# Patient Record
Sex: Male | Born: 1995
Health system: Southern US, Community
[De-identification: ages and names within clinical notes are randomized; demographics above are authoritative.]

## PROBLEM LIST (undated history)

## (undated) DIAGNOSIS — F339 Major depressive disorder, recurrent, unspecified: Secondary | ICD-10-CM

## (undated) DIAGNOSIS — F429 Obsessive-compulsive disorder, unspecified: Secondary | ICD-10-CM

## (undated) HISTORY — DX: Major depressive disorder, recurrent, unspecified: F33.9

## (undated) HISTORY — PX: OTHER SURGICAL HISTORY: SHX169

---
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
## 2017-12-16 ENCOUNTER — Ambulatory Visit: Payer: Self-pay | Admitting: Family Medicine

## 2017-12-16 NOTE — Progress Notes (Signed)
Subjective:    Guy [name] is a 22 y.o. male who presents today for his Complete Annual Exam.   Current Outpatient Medications:  .  escitalopram (LEXAPRO) 10 MG tablet, 1 1/2 tab daily, Disp: 30 tablet, Rfl: 5  [facility]17/2017  Decreased Interest 0  Down, Depressed, Hopeless 0  PHQ - 2 Score 0  Altered sleeping 2  Tired, decreased energy 0  Change in appetite 0  Feeling bad or failure about yourself  0  Trouble concentrating 0  Moving slowly or fidgety/restless 0  Suicidal thoughts 0  PHQ-9 Score 2  Difficult doing work/chores Not difficult at all   Health Maintenance Due  Topic Date Due  . HIV Screening  03/12/2012  . TETANUS/TDAP  03/12/2016    PMHx, SurgHx, SocialHx, Medications, and Allergies were reviewed in the Visit Navigator and updated as appropriate.   Past Medical History:  Diagnosis Date  . MDD (major depressive disorder), recurrent episode (HCC)   . OCD (obsessive compulsive disorder)     Past Surgical History:  Procedure Laterality Date  . ACL replacement Bilateral    2 on right 1 on left    History reviewed. No pertinent family history. Social History   Tobacco Use  . Smoking status: Never Smoker  . Smokeless tobacco: Never Used  Substance Use Topics  . Alcohol use: No    Alcohol/week: 0.0 oz  . Drug use: No    Review of Systems:   Pertinent items are noted in the HPI. Otherwise, ROS is negative.  Objective:   Vitals:   12/17/17 1021  BP: 108/62  Pulse: 84  Temp: 98.6 F (37 C)  SpO2: 98%   Body mass index is 27.62 kg/m.  General Appearance:  Alert, cooperative, no distress, appears stated age  Head:  Normocephalic, without obvious abnormality, atraumatic  Eyes:  PERRL, conjunctiva/corneas clear, EOM's intact, fundi benign, both eyes       Ears:  Normal TM's and external ear canals, both ears  Nose: Nares normal, septum midline, mucosa normal, no drainage    or sinus tenderness  Throat: Lips, mucosa,  and tongue normal; teeth and gums normal  Neck: Supple, symmetrical, trachea midline, no adenopathy; thyroid:  No enlargement/tenderness/nodules; no carotit bruit or JVD  Back:   Symmetric, no curvature, ROM normal, no CVA tenderness  Lungs:   Clear to auscultation bilaterally, respirations unlabored  Chest wall:  No tenderness or deformity  Heart:  Regular rate and rhythm, S1 and S2 normal, no murmur, rub   or gallop  Abdomen:   Soft, non-tender, bowel sounds active all four quadrants, no masses, no organomegaly  Extremities: Extremities normal, atraumatic, no cyanosis or edema  Prostate: Not done.   Skin: Skin color, texture, turgor normal, no rashes or lesions  Lymph nodes: Cervical, supraclavicular, and axillary nodes normal  Neurologic: CNII-XII grossly intact. Normal strength, sensation and reflexes throughout    Assessment/Plan:   Guy [name] was seen today for establish care.  Diagnoses and all orders for this visit:  Routine physical examination  Encounter for screening for HIV -     HIV antibody  Weight gain -     CBC with Differential/Platelet -     Comprehensive metabolic panel -     Lipid panel  Dysthymia -     escitalopram (LEXAPRO) 10 MG tablet; Take 1 1/2 every morning   Patient Counseling: [x]   Nutrition: Stressed importance of moderation in sodium/caffeine intake, saturated fat and cholesterol, caloric  balance, sufficient intake of fresh fruits, vegetables, and fiber.  [x]   Stressed the importance of regular exercise.   []   Substance Abuse: Discussed cessation/primary prevention of tobacco, alcohol, or other drug use; driving or other dangerous activities under the influence; availability of treatment for abuse.   [x]   Injury prevention: Discussed safety belts, safety helmets, smoke detector, smoking near bedding or upholstery.   []   Sexuality: Discussed sexually transmitted diseases, partner selection, use of condoms, avoidance of unintended pregnancy and  contraceptive alternatives.   [x]   Dental health: Discussed importance of regular tooth brushing, flossing, and dental visits.  [x]   Health maintenance and immunizations reviewed. Please refer to Health maintenance section.    Helane Rima, DO [facility]

## 2017-12-17 ENCOUNTER — Encounter: Payer: Self-pay | Admitting: Family Medicine

## 2017-12-17 ENCOUNTER — Ambulatory Visit (INDEPENDENT_AMBULATORY_CARE_PROVIDER_SITE_OTHER): Payer: 59 | Admitting: Family Medicine

## 2017-12-17 ENCOUNTER — Telehealth: Payer: Self-pay

## 2017-12-17 VITALS — BP 108/62 | HR 84 | Temp 98.6°F | Ht 69.0 in | Wt 187.0 lb

## 2017-12-17 DIAGNOSIS — F341 Dysthymic disorder: Secondary | ICD-10-CM | POA: Diagnosis not present

## 2017-12-17 DIAGNOSIS — R635 Abnormal weight gain: Secondary | ICD-10-CM | POA: Diagnosis not present

## 2017-12-17 DIAGNOSIS — Z0000 Encounter for general adult medical examination without abnormal findings: Secondary | ICD-10-CM | POA: Diagnosis not present

## 2017-12-17 DIAGNOSIS — Z114 Encounter for screening for human immunodeficiency virus [HIV]: Secondary | ICD-10-CM

## 2017-12-17 LAB — LIPID PANEL
Cholesterol: 168 mg/dL (ref 0–200)
HDL: 31.3 mg/dL — ABNORMAL LOW (ref >39.00)
LDL Cholesterol: 122 mg/dL — ABNORMAL HIGH (ref 0–99)
NonHDL: 136.88
Total CHOL/HDL Ratio: 5
Triglycerides: 74 mg/dL (ref 0.0–149.0)
VLDL: 14.8 mg/dL (ref 0.0–40.0)

## 2017-12-17 LAB — CBC WITH DIFFERENTIAL/PLATELET
Basophils Absolute: 0 10*3/uL (ref 0.0–0.1)
Basophils Relative: 0.5 % (ref 0.0–3.0)
Eosinophils Absolute: 0.1 10*3/uL (ref 0.0–0.7)
Eosinophils Relative: 2.1 % (ref 0.0–5.0)
HCT: 51.3 % (ref 39.0–52.0)
Hemoglobin: 18.1 g/dL (ref 13.0–17.0)
Lymphocytes Relative: 35.5 % (ref 12.0–46.0)
Lymphs Abs: 2.1 10*3/uL (ref 0.7–4.0)
MCHC: 35.2 g/dL (ref 30.0–36.0)
MCV: 83.2 fl (ref 78.0–100.0)
Monocytes Absolute: 0.5 10*3/uL (ref 0.1–1.0)
Monocytes Relative: 8.4 % (ref 3.0–12.0)
Neutro Abs: 3.1 10*3/uL (ref 1.4–7.7)
Neutrophils Relative %: 53.5 % (ref 43.0–77.0)
Platelets: 282 10*3/uL (ref 150.0–400.0)
RBC: 6.17 Mil/uL — ABNORMAL HIGH (ref 4.22–5.81)
RDW: 12.9 % (ref 11.5–14.6)
WBC: 5.9 10*3/uL (ref 4.5–10.5)

## 2017-12-17 LAB — COMPREHENSIVE METABOLIC PANEL
ALT: 34 U/L (ref 0–53)
AST: 26 U/L (ref 0–37)
Albumin: 4.7 g/dL (ref 3.5–5.2)
Alkaline Phosphatase: 77 U/L (ref 39–117)
BUN: 18 mg/dL (ref 6–23)
CO2: 25 mEq/L (ref 19–32)
Calcium: 10.1 mg/dL (ref 8.4–10.5)
Chloride: 105 mEq/L (ref 96–112)
Creatinine, Ser: 1.16 mg/dL (ref 0.40–1.50)
GFR: 84.66 mL/min (ref >60.00)
Glucose, Bld: 86 mg/dL (ref 70–99)
Potassium: 4.1 mEq/L (ref 3.5–5.1)
Sodium: 139 mEq/L (ref 135–145)
Total Bilirubin: 3.5 mg/dL — ABNORMAL HIGH (ref 0.2–1.2)
Total Protein: 7.4 g/dL (ref 6.0–8.3)

## 2017-12-17 LAB — CBC AND DIFFERENTIAL
HEMATOCRIT: 51 (ref 41–53)
Hemoglobin: 18.1 — AB (ref 13.5–17.5)
Platelets: 282 (ref 150–399)
WBC: 5.9

## 2017-12-17 LAB — HEPATIC FUNCTION PANEL: BILIRUBIN, TOTAL: 3.5

## 2017-12-17 MED ORDER — ESCITALOPRAM OXALATE 10 MG PO TABS: ORAL_TABLET | ORAL | 5 refills | Status: AC

## 2017-12-17 MED ORDER — ESCITALOPRAM OXALATE 10 MG PO TABS: ORAL_TABLET | ORAL | 1 refills | Status: AC

## 2017-12-17 NOTE — Telephone Encounter (Signed)
Per EW need to hydrate and recheck Monday

## 2017-12-17 NOTE — Telephone Encounter (Signed)
[facility] called with critical result on pt. Hgb 18.1

## 2017-12-17 NOTE — Telephone Encounter (Signed)
Called and spoke to mom. She repeated information back to me lab app made for Monday pm

## 2017-12-18 LAB — HIV ANTIBODY (ROUTINE TESTING W REFLEX): HIV 1&2 Ab, 4th Generation: NONREACTIVE

## 2017-12-20 ENCOUNTER — Other Ambulatory Visit (INDEPENDENT_AMBULATORY_CARE_PROVIDER_SITE_OTHER): Payer: 59

## 2017-12-20 ENCOUNTER — Telehealth: Payer: Self-pay

## 2017-12-20 ENCOUNTER — Other Ambulatory Visit: Payer: Self-pay

## 2017-12-20 DIAGNOSIS — R899 Unspecified abnormal finding in specimens from other organs, systems and tissues: Secondary | ICD-10-CM

## 2017-12-20 LAB — CBC WITH DIFFERENTIAL/PLATELET
Basophils Absolute: 0 10*3/uL (ref 0.0–0.1)
Basophils Relative: 0.8 % (ref 0.0–3.0)
Eosinophils Absolute: 0.1 10*3/uL (ref 0.0–0.7)
Eosinophils Relative: 1.9 % (ref 0.0–5.0)
HCT: 49.8 % (ref 39.0–52.0)
Hemoglobin: 17.6 g/dL — ABNORMAL HIGH (ref 13.0–17.0)
Lymphocytes Relative: 35 % (ref 12.0–46.0)
Lymphs Abs: 1.9 10*3/uL (ref 0.7–4.0)
MCHC: 35.4 g/dL (ref 30.0–36.0)
MCV: 82.7 fl (ref 78.0–100.0)
Monocytes Absolute: 0.4 10*3/uL (ref 0.1–1.0)
Monocytes Relative: 7.1 % (ref 3.0–12.0)
Neutro Abs: 3 10*3/uL (ref 1.4–7.7)
Neutrophils Relative %: 55.2 % (ref 43.0–77.0)
Platelets: 281 10*3/uL (ref 150.0–400.0)
RBC: 6.02 Mil/uL — ABNORMAL HIGH (ref 4.22–5.81)
RDW: 12.9 % (ref 11.5–14.6)
WBC: 5.4 10*3/uL (ref 4.5–10.5)

## 2017-12-20 LAB — COMPREHENSIVE METABOLIC PANEL
ALT: 33 U/L (ref 0–53)
AST: 21 U/L (ref 0–37)
Albumin: 4.6 g/dL (ref 3.5–5.2)
Alkaline Phosphatase: 71 U/L (ref 39–117)
BUN: 14 mg/dL (ref 6–23)
CO2: 26 mEq/L (ref 19–32)
Calcium: 9.7 mg/dL (ref 8.4–10.5)
Chloride: 105 mEq/L (ref 96–112)
Creatinine, Ser: 1.1 mg/dL (ref 0.40–1.50)
GFR: 90.01 mL/min (ref >60.00)
Glucose, Bld: 87 mg/dL (ref 70–99)
Potassium: 4.4 mEq/L (ref 3.5–5.1)
Sodium: 140 mEq/L (ref 135–145)
Total Bilirubin: 2 mg/dL — ABNORMAL HIGH (ref 0.2–1.2)
Total Protein: 7 g/dL (ref 6.0–8.3)

## 2017-12-20 LAB — CBC AND DIFFERENTIAL: WBC: 5.4

## 2017-12-20 NOTE — Telephone Encounter (Signed)
Orders in 

## 2017-12-20 NOTE — Telephone Encounter (Signed)
Pt coming for repeat lab today. Please place future order. Thank you.

## 2017-12-21 ENCOUNTER — Other Ambulatory Visit: Payer: Self-pay

## 2017-12-21 ENCOUNTER — Encounter: Payer: Self-pay | Admitting: Family Medicine

## 2017-12-21 ENCOUNTER — Telehealth: Payer: Self-pay | Admitting: Family Medicine

## 2017-12-21 MED ORDER — DOXYCYCLINE HYCLATE 100 MG PO TABS: 100.0000 mg | ORAL_TABLET | Freq: Two times a day (BID) | ORAL | 0 refills | Status: AC

## 2017-12-21 NOTE — Telephone Encounter (Signed)
Called mom script called in

## 2017-12-21 NOTE — Telephone Encounter (Signed)
FYI

## 2017-12-21 NOTE — Telephone Encounter (Signed)
See note

## 2017-12-21 NOTE — Telephone Encounter (Signed)
Copied from CRM [id number]. Topic: General - Other >> Dec 21, 2017  2:27 PM Leafy Roobinson, Norma J wrote:  Reason for CRM pt saw dr wallace on 12-17-17 as a new pt. Pt went hiking on 6-15 and mom notice a tick on him today and she will remove today. Pt mom is Charity fundraiserN. Pt mom would like dr Earlene Platerwallace to be aware. Pt will [place] going to [place] and leaving tomorrow. Pt mom will sent a picture thru mychart

## 2017-12-21 NOTE — Telephone Encounter (Signed)
Okay to Rx Doxy 100 BID x 10 days as safety net Rx. Remind of photosensitivity.

## 2018-01-04 NOTE — Telephone Encounter (Signed)
See note.   Copied from CRM [id number]. Topic: Referral - Request >> Jan 04, 2018  4:43 PM Arlyss Gandyichardson, Taren N, NT wrote: Reason for CRM: Pts mom calling and requesting a referral to a hemat[place] in [place] due to lab work he had done with Dr. Earlene PlaterWallace. The doctors name is Duffy BruceStephen L Walentine. Fax#: [phone] AttnRalph Leyden: Maren Phone#: [phone] This office will also need the 2 most recent lab results for this pt.

## 2018-01-12 ENCOUNTER — Telehealth: Payer: Self-pay | Admitting: Family Medicine

## 2018-01-12 DIAGNOSIS — D751 Secondary polycythemia: Secondary | ICD-10-CM

## 2018-01-12 NOTE — Telephone Encounter (Signed)
Please send to Dr. Mallie SnooksStephen Walentine . Information is in previous message.

## 2018-01-12 NOTE — Telephone Encounter (Signed)
Referral has been faxed to Dr. Doreene AdasSteven Wallentine's office per request.  PHN: [id number] FAX: [phone]/ [phone] [address], [place], [place] [id number]

## 2018-01-13 NOTE — Telephone Encounter (Signed)
Patient mom informed she will give us a call when appointment is made. And let us know if they have not heard from them in a few days.

## 2018-01-13 NOTE — Telephone Encounter (Signed)
Joellen please call patient and discuss.

## 2018-01-13 NOTE — Telephone Encounter (Signed)
Please make sure that the patient and family are aware.

## 2018-01-14 ENCOUNTER — Encounter: Payer: Self-pay | Admitting: Family Medicine

## 2018-05-26 LAB — CBC AND DIFFERENTIAL
HCT: 48 (ref 41–53)
HEMOGLOBIN: 17.2 (ref 13.5–17.5)
Neutrophils Absolute: 4
PLATELETS: 269 (ref 150–399)
WBC: 5.8

## 2018-05-26 LAB — BASIC METABOLIC PANEL
BUN: 15 (ref 4–21)
Creatinine: 1.1 (ref 0.6–1.3)
Glucose: 91
Potassium: 4.3 (ref 3.4–5.3)
SODIUM: 140 (ref 137–147)

## 2018-05-26 LAB — HEPATIC FUNCTION PANEL
ALT: 34 (ref 10–40)
AST: 21 (ref 14–40)
Alkaline Phosphatase: 74 (ref 25–125)
Bilirubin, Total: 2.3

## 2018-05-31 ENCOUNTER — Encounter: Payer: Self-pay | Admitting: Family Medicine
